# Patient Record
Sex: Male | Born: 1945 | Race: White | Hispanic: No | Marital: Married
Health system: Midwestern US, Academic
[De-identification: ages and names within clinical notes are randomized; demographics above are authoritative.]

---

## 2017-06-09 ENCOUNTER — Ambulatory Visit: Admit: 2017-06-09 | Discharge: 2017-06-10 | Payer: MEDICARE

## 2017-06-09 ENCOUNTER — Encounter: Admit: 2017-06-09 | Discharge: 2017-06-09 | Payer: MEDICARE

## 2017-06-09 DIAGNOSIS — R600 Localized edema: ICD-10-CM

## 2017-06-09 DIAGNOSIS — I10 Essential (primary) hypertension: Principal | ICD-10-CM

## 2017-06-09 DIAGNOSIS — M549 Dorsalgia, unspecified: ICD-10-CM

## 2017-06-09 DIAGNOSIS — Z8546 Personal history of malignant neoplasm of prostate: ICD-10-CM

## 2017-06-09 DIAGNOSIS — R609 Edema, unspecified: Principal | ICD-10-CM

## 2017-06-09 MED ORDER — METOPROLOL SUCCINATE 50 MG PO TB24
50 mg | ORAL_TABLET | Freq: Every day | ORAL | 3 refills | 90.00000 days | Status: AC
Start: 2017-06-09 — End: ?

## 2017-06-09 MED ORDER — LOSARTAN-HYDROCHLOROTHIAZIDE 100-25 MG PO TAB
1 | ORAL_TABLET | Freq: Every day | ORAL | 3 refills | 28.00000 days | Status: AC
Start: 2017-06-09 — End: 2019-07-19

## 2018-01-07 ENCOUNTER — Encounter: Admit: 2018-01-07 | Discharge: 2018-01-07 | Payer: MEDICARE

## 2018-01-07 DIAGNOSIS — I10 Essential (primary) hypertension: Principal | ICD-10-CM

## 2018-01-07 DIAGNOSIS — Z8546 Personal history of malignant neoplasm of prostate: ICD-10-CM

## 2018-01-07 DIAGNOSIS — M549 Dorsalgia, unspecified: ICD-10-CM

## 2018-05-27 LAB — LIPID PROFILE
Lab: 148 — ABNORMAL LOW (ref 150–200)
Lab: 239 — ABNORMAL HIGH (ref 30–200)
Lab: 31 — ABNORMAL LOW (ref 35–60)
Lab: 48 — ABNORMAL HIGH (ref 5–40)
Lab: 5

## 2018-06-04 LAB — COMPREHENSIVE METABOLIC PANEL
Lab: 0.5
Lab: 1.1
Lab: 104 — ABNORMAL LOW (ref 42.0–52.0)
Lab: 142 — ABNORMAL LOW (ref 4.70–6.10)
Lab: 23 — ABNORMAL HIGH (ref 27.0–31.0)
Lab: 28
Lab: 3.6 — ABNORMAL LOW (ref 14.0–18.0)
Lab: 4.1
Lab: 9.4
Lab: 91
Lab: 95

## 2018-06-04 LAB — CBC: Lab: 8.4

## 2018-07-06 ENCOUNTER — Ambulatory Visit: Admit: 2018-07-06 | Discharge: 2018-07-07 | Payer: MEDICARE

## 2018-07-06 ENCOUNTER — Encounter: Admit: 2018-07-06 | Discharge: 2018-07-06 | Payer: MEDICARE

## 2018-07-06 DIAGNOSIS — I10 Essential (primary) hypertension: Principal | ICD-10-CM

## 2018-07-06 DIAGNOSIS — M549 Dorsalgia, unspecified: ICD-10-CM

## 2018-07-06 DIAGNOSIS — Z8546 Personal history of malignant neoplasm of prostate: ICD-10-CM

## 2018-07-06 DIAGNOSIS — E7800 Pure hypercholesterolemia, unspecified: ICD-10-CM

## 2018-11-15 ENCOUNTER — Encounter: Admit: 2018-11-15 | Discharge: 2018-11-16 | Payer: MEDICARE

## 2018-11-18 ENCOUNTER — Encounter: Admit: 2018-11-18 | Discharge: 2018-11-19 | Payer: MEDICARE

## 2018-12-03 ENCOUNTER — Encounter: Admit: 2018-12-03 | Discharge: 2018-12-03 | Payer: MEDICARE

## 2018-12-23 ENCOUNTER — Encounter: Admit: 2018-12-23 | Discharge: 2018-12-23 | Payer: MEDICARE

## 2018-12-30 ENCOUNTER — Encounter: Admit: 2018-12-30 | Discharge: 2018-12-30 | Payer: MEDICARE

## 2018-12-31 ENCOUNTER — Ambulatory Visit: Admit: 2018-12-31 | Discharge: 2019-01-01 | Payer: MEDICARE

## 2018-12-31 ENCOUNTER — Encounter: Admit: 2018-12-31 | Discharge: 2018-12-31 | Payer: MEDICARE

## 2018-12-31 DIAGNOSIS — R600 Localized edema: Principal | ICD-10-CM

## 2018-12-31 MED ORDER — DOXAZOSIN 8 MG PO TR24
8 mg | ORAL_TABLET | Freq: Every day | ORAL | 3 refills | 30.00000 days | Status: AC
Start: 2018-12-31 — End: 2019-01-04

## 2019-01-03 ENCOUNTER — Encounter: Admit: 2019-01-03 | Discharge: 2019-01-03 | Payer: MEDICARE

## 2019-01-04 ENCOUNTER — Encounter: Admit: 2019-01-04 | Discharge: 2019-01-04 | Payer: MEDICARE

## 2019-01-04 MED ORDER — DOXAZOSIN 8 MG PO TAB
8 mg | Freq: Every day | ORAL | 0 refills | 30.00000 days | Status: AC
Start: 2019-01-04 — End: 2019-07-19

## 2019-01-10 ENCOUNTER — Encounter: Admit: 2019-01-10 | Discharge: 2019-01-10 | Payer: MEDICARE

## 2019-07-19 ENCOUNTER — Encounter: Admit: 2019-07-19 | Discharge: 2019-07-19 | Payer: MEDICARE

## 2019-07-19 DIAGNOSIS — I10 Essential (primary) hypertension: Secondary | ICD-10-CM

## 2019-07-19 DIAGNOSIS — M549 Dorsalgia, unspecified: Secondary | ICD-10-CM

## 2019-07-19 DIAGNOSIS — Z8546 Personal history of malignant neoplasm of prostate: Secondary | ICD-10-CM

## 2019-07-19 MED ORDER — DOXAZOSIN 8 MG PO TAB
8 mg | ORAL_TABLET | Freq: Every day | ORAL | 3 refills | 30.00000 days | Status: DC
Start: 2019-07-19 — End: 2019-10-17

## 2019-07-19 MED ORDER — DOXAZOSIN 2 MG PO TAB
2 mg | ORAL_TABLET | Freq: Every day | ORAL | 3 refills | 30.00000 days | Status: DC
Start: 2019-07-19 — End: 2019-08-16

## 2019-08-02 ENCOUNTER — Encounter: Admit: 2019-08-02 | Discharge: 2019-08-02 | Payer: MEDICARE

## 2019-08-02 NOTE — Progress Notes
Patient presented to clinic for a nursing visit for a blood pressure check after recent medication changes.  Blood pressure still remains elevated.  Today bp is 142/84 p 65.  Pt denies complaints.  Patient reports he is checking his blood pressure before his morning medication then about an hour or so after his medications.  I asked pt to check his blood pressure about an hour after his morning medications and sit and rest 10-15 prior to checking bp.  Per TLR note, increase doxazosin to 12 mg daily. Pt verbalizes understanding. He will continue to monitor bp at home and bring a log when he returns in 2 weeks for a bp check.      Home bp readings:    08/02/19 161/100  08/01/19 147/93  10/25  145/91  10/24  138/92  10/23  143/99 154/81  10/22  146/96 133/88  10/21  164/91 148/82  10/20  154/99  10/19  150/98  10/18  138/97  10/16  140/98  10/15  146/93  10/14  152/87

## 2019-08-16 ENCOUNTER — Encounter: Admit: 2019-08-16 | Discharge: 2019-08-16 | Payer: MEDICARE

## 2019-08-16 MED ORDER — DOXAZOSIN 2 MG PO TAB
4 mg | ORAL_TABLET | Freq: Every day | ORAL | 3 refills | 30.00000 days | Status: DC
Start: 2019-08-16 — End: 2019-09-08

## 2019-09-08 ENCOUNTER — Encounter: Admit: 2019-09-08 | Discharge: 2019-09-08 | Payer: MEDICARE

## 2019-09-08 MED ORDER — DOXAZOSIN 2 MG PO TAB
4 mg | ORAL_TABLET | Freq: Every day | ORAL | 3 refills | 30.00000 days | Status: AC
Start: 2019-09-08 — End: ?

## 2019-10-16 ENCOUNTER — Encounter: Admit: 2019-10-16 | Discharge: 2019-10-16 | Payer: MEDICARE

## 2019-10-17 MED ORDER — DOXAZOSIN 8 MG PO TAB
ORAL_TABLET | Freq: Every day | ORAL | 3 refills | 30.00000 days | Status: AC
Start: 2019-10-17 — End: ?

## 2020-01-06 ENCOUNTER — Encounter: Admit: 2020-01-06 | Discharge: 2020-01-06 | Payer: MEDICARE

## 2020-01-06 DIAGNOSIS — Z8546 Personal history of malignant neoplasm of prostate: Secondary | ICD-10-CM

## 2020-01-06 DIAGNOSIS — M549 Dorsalgia, unspecified: Secondary | ICD-10-CM

## 2020-01-06 DIAGNOSIS — M545 Low back pain: Secondary | ICD-10-CM

## 2020-01-06 DIAGNOSIS — I10 Essential (primary) hypertension: Secondary | ICD-10-CM

## 2020-01-12 ENCOUNTER — Ambulatory Visit: Admit: 2020-01-12 | Discharge: 2020-01-12 | Payer: MEDICARE

## 2020-01-12 ENCOUNTER — Encounter: Admit: 2020-01-12 | Discharge: 2020-01-12 | Payer: MEDICARE

## 2020-01-12 NOTE — Progress Notes
[facility] HISTORY AND PHYSICAL    No chief complaint on file.      Subjective      Dictation on: 01/12/2020  2:04 PM by: Sue Lush [RSTUCKER]              Medical History:   Diagnosis Date   ? Chronic back pain 04/02/2009   ? History of prostate cancer 04/02/2009   ? HTN (hypertension) 04/02/2009       Surgical History:   Procedure Laterality Date   ? CHOLANGIOPANCREATOGRAPHY ENDOSCOPY RETROGRADE N/A 08/26/2015    Performed by Vertell Novak, MD at [facility] ENDO   ? CHOLECYSTECTOMY     ? KNEE REPLACEMENT Left    ? KNEE REPLACEMENT Right    ? LAMINECTOMY     ? TONSILLECTOMY         family history includes Cancer in his sister; Coronary Artery Disease in his father; Diabetes in his brother, sister, and sister; Glaucoma in his mother.    Social History     Socioeconomic History   ? Marital status: Married     Spouse name: Not on file   ? Number of children: Not on file   ? Years of education: Not on file   ? Highest education level: Not on file   Occupational History   ? Not on file   Tobacco Use   ? Smoking status: Former Smoker   ? Smokeless tobacco: Never Used   Substance and Sexual Activity   ? Alcohol use: No   ? Drug use: No   ? Sexual activity: Not on file   Other Topics Concern   ? Not on file   Social History Narrative   ? Not on file       Allergies   Allergen Reactions   ? Asacol [Mesalamine] UNKNOWN   ? Bextra [Valdecoxib] UNKNOWN   ? Codeine UNKNOWN   ? Pcn [Penicillins] UNKNOWN   ? Ultram [Tramadol] UNKNOWN   ? Vioxx [Rofecoxib] UNKNOWN         Current Outpatient Medications:   ?  doxazosin (CARDURA) 2 mg tablet, Take two tablets by mouth daily., Disp: 180 tablet, Rfl: 3  ?  doxazosin (CARDURA) 8 mg tablet, Take 1 tablet by mouth once daily, Disp: 90 tablet, Rfl: 3  ?  loratadine (CLARITIN) 10 mg tablet, Take 10 mg by mouth every morning., Disp: , Rfl:   ?  losartan (COZAAR) 100 mg tablet, Take 100 mg by mouth daily., Disp: , Rfl:   ?  meloxicam (MOBIC) 7.5 mg tablet, Take 7.5 mg by mouth daily., Disp: , Rfl:   ?  metoprolol XL (TOPROL XL) 50 mg extended release tablet, Take one tablet by mouth daily., Disp: 90 tablet, Rfl: 3  ?  omeprazole DR(+) (PRILOSEC) 40 mg capsule, Take 1 capsule by mouth daily., Disp: , Rfl:     Vitals:    01/12/20 1323   Weight: 111.6 kg (246 lb)   Height: 182 cm (71.65)   PainSc: Two            Pain Score: Two    Body mass index is 33.69 kg/m?Marland Kitchen    Review of Systems

## 2020-01-13 ENCOUNTER — Encounter: Admit: 2020-01-13 | Discharge: 2020-01-13 | Payer: MEDICARE

## 2020-01-13 DIAGNOSIS — I10 Essential (primary) hypertension: Secondary | ICD-10-CM

## 2020-01-13 DIAGNOSIS — M549 Dorsalgia, unspecified: Secondary | ICD-10-CM

## 2020-01-13 DIAGNOSIS — Z8546 Personal history of malignant neoplasm of prostate: Secondary | ICD-10-CM

## 2020-01-14 ENCOUNTER — Encounter: Admit: 2020-01-14 | Discharge: 2020-01-14 | Payer: MEDICARE

## 2020-10-09 ENCOUNTER — Encounter: Admit: 2020-10-09 | Discharge: 2020-10-09 | Payer: MEDICARE

## 2020-10-09 MED ORDER — DOXAZOSIN 2 MG PO TAB
ORAL_TABLET | Freq: Every day | ORAL | 0 refills | 30.00000 days | Status: AC
Start: 2020-10-09 — End: ?

## 2020-10-18 ENCOUNTER — Encounter: Admit: 2020-10-18 | Discharge: 2020-10-18 | Payer: MEDICARE

## 2020-10-18 MED ORDER — DOXAZOSIN 8 MG PO TAB
ORAL_TABLET | Freq: Every day | 0 refills
Start: 2020-10-18 — End: ?

## 2021-04-07 ENCOUNTER — Encounter: Admit: 2021-04-07 | Discharge: 2021-04-07 | Payer: MEDICARE

## 2021-04-07 MED ORDER — DOXAZOSIN 2 MG PO TAB
ORAL_TABLET | Freq: Every day | 0 refills
Start: 2021-04-07 — End: ?

## 2021-04-11 ENCOUNTER — Encounter: Admit: 2021-04-11 | Discharge: 2021-04-11 | Payer: MEDICARE

## 2021-04-11 MED ORDER — DOXAZOSIN 2 MG PO TAB
ORAL_TABLET | Freq: Every day | 0 refills
Start: 2021-04-11 — End: ?

## 2023-03-26 IMAGING — MR SPCERVWO
6 of 9 series · 27 of 48 positions shown · non-contrast
Comparison: none

[Series 5: T2 · sagittal · 3.0mm · 0.69mm/px · 3 of 17 slices shown (1 of 2)]
[im 1/17]
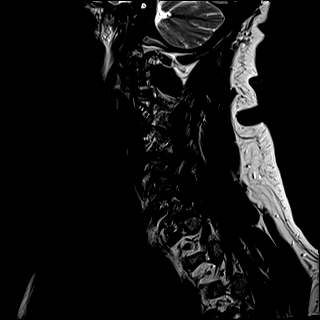
[im 9/17]
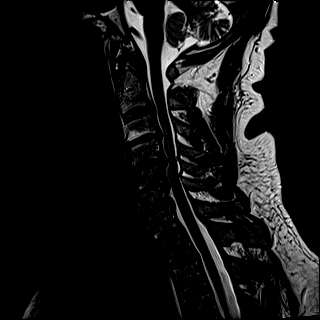
[im 17/17]
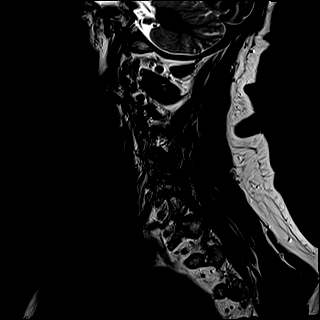

[Series 6: T1 · sagittal · 3.0mm · 0.43mm/px · 3 of 17 slices shown (1 of 2)]
[im 1/17]
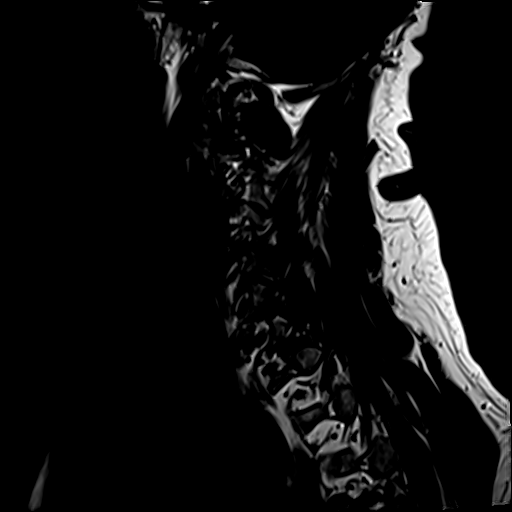
[im 9/17]
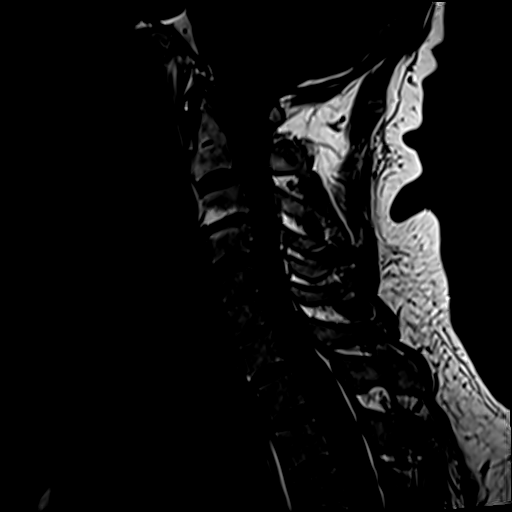
[im 17/17]
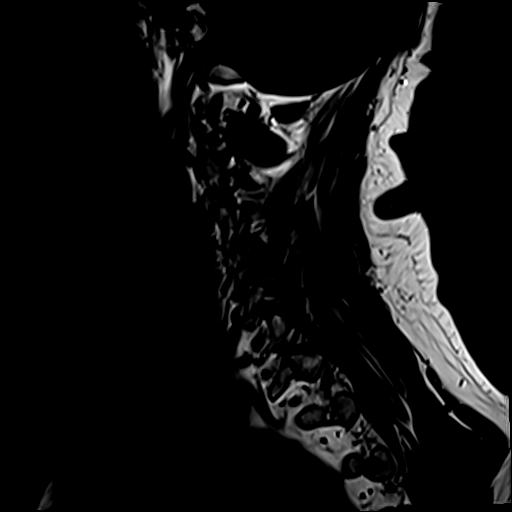

[Series 7: STIR · sagittal · 3.0mm · 0.86mm/px · 3 of 17 slices shown]
[im 1/17]
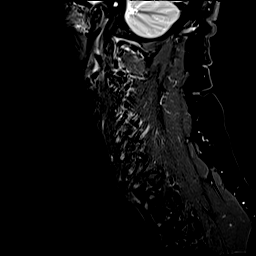
[im 9/17]
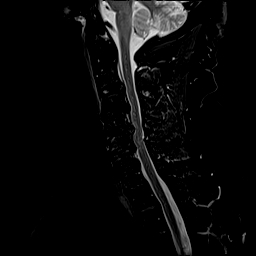
[im 17/17]
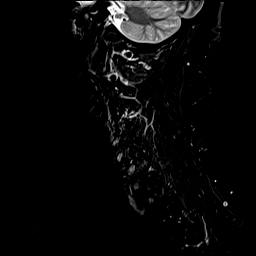

[Series 8: T2 · axial · 3.0mm · 0.70mm/px · z∈[-131,-21]mm · 6 of 35 slices shown (2 of 2)]
[im 1/35]
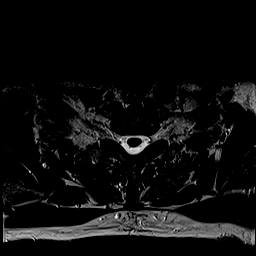
[im 7/35]
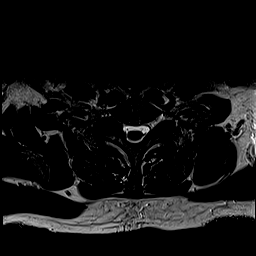
[im 14/35]
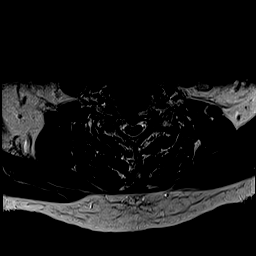
[im 21/35]
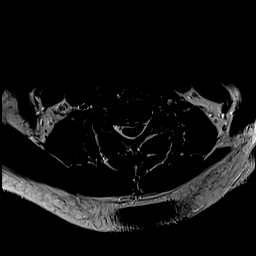
[im 28/35]
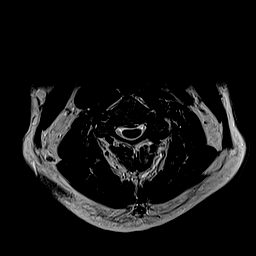
[im 35/35]
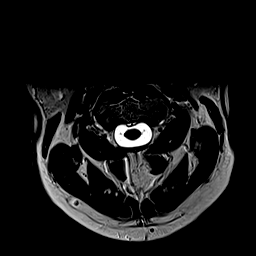

[Series 9: GRE · axial · 3.0mm · 0.47mm/px · z∈[-131,-21]mm · 6 of 35 slices shown]
[im 1/35]
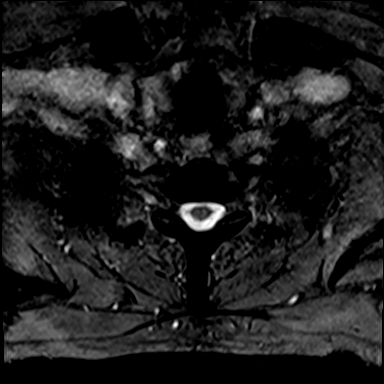
[im 7/35]
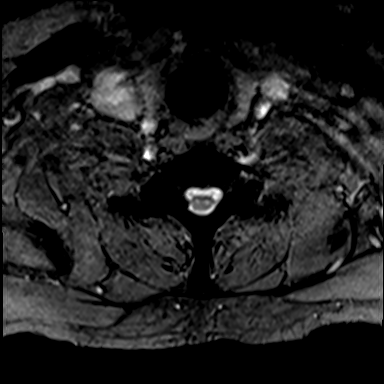
[im 14/35]
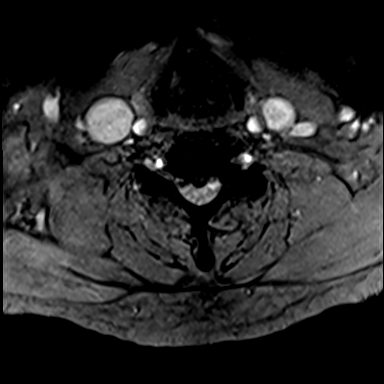
[im 21/35]
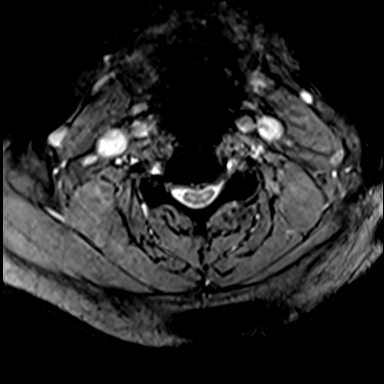
[im 28/35]
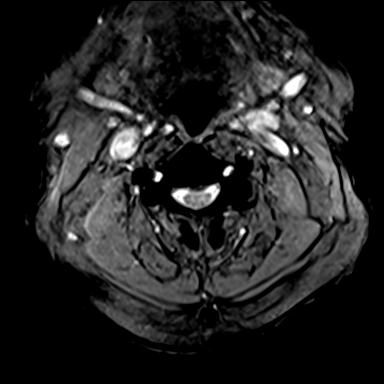
[im 35/35]
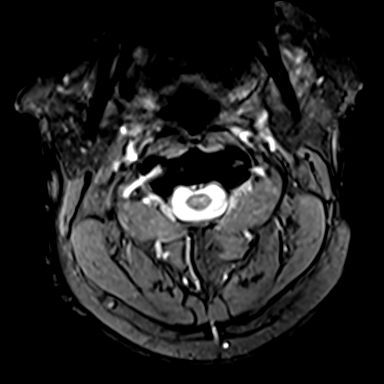

[Series 10: T1 · axial · 3.0mm · 0.35mm/px · z∈[-131,-21]mm · 6 of 35 slices shown (2 of 2)]
[im 1/35]
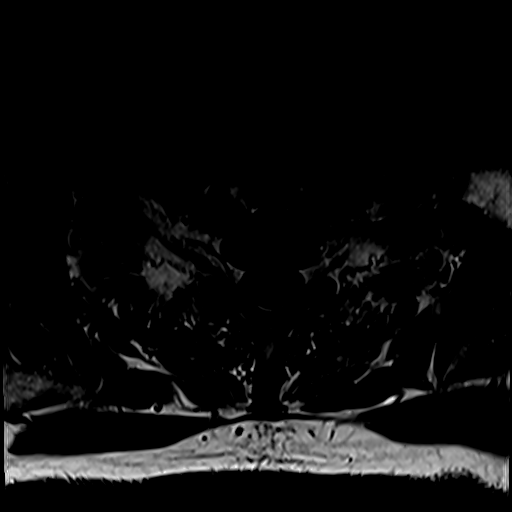
[im 7/35]
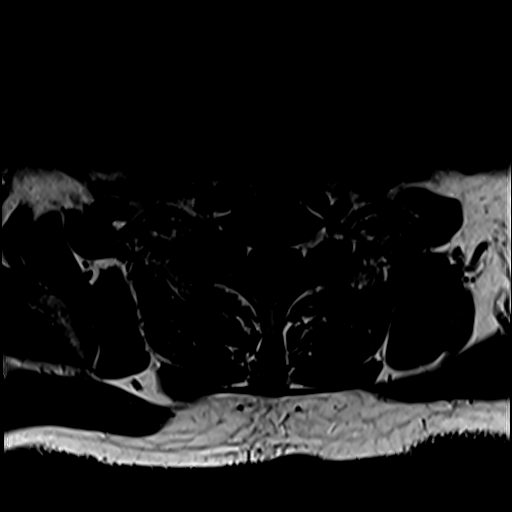
[im 14/35]
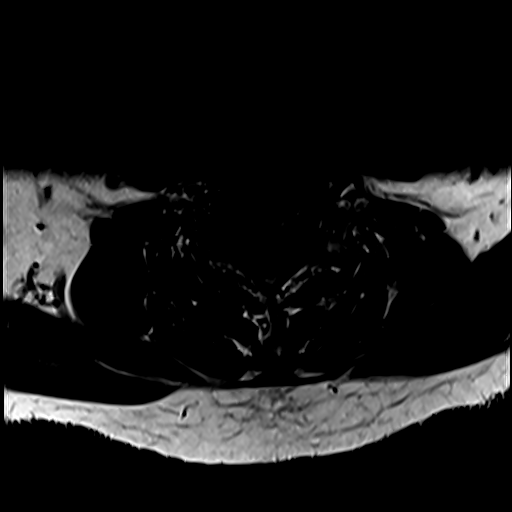
[im 21/35]
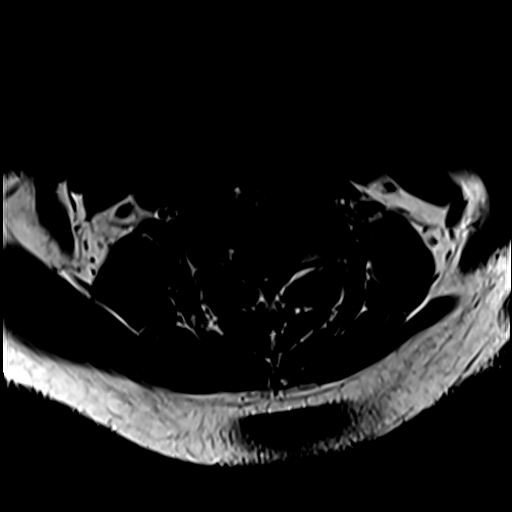
[im 28/35]
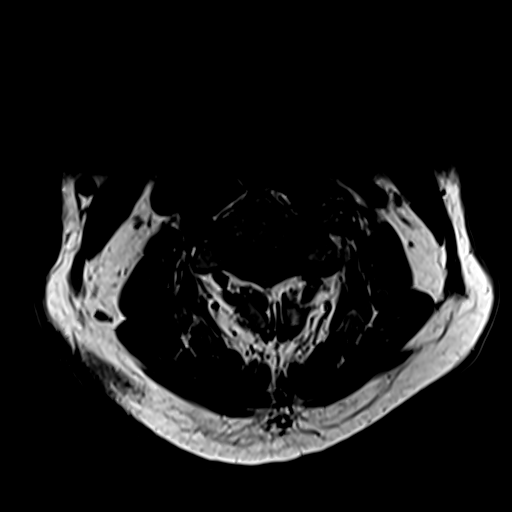
[im 35/35]
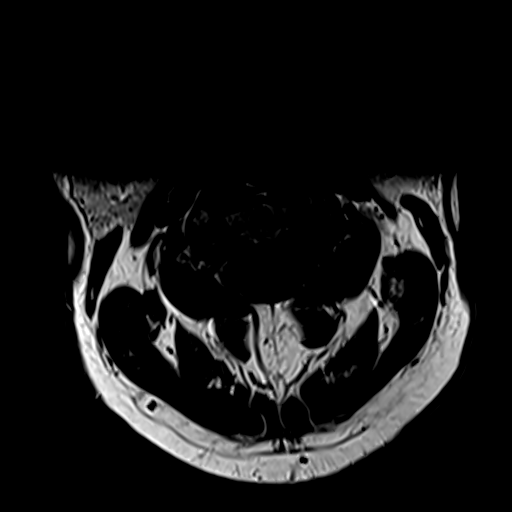

[27 of 48 positions shown; findings below may reference images not displayed]

Ordering:    TIGER, CALLUM

DIAGNOSTIC STUDIES

EXAM

MRI of the cervical spine without contrast.

INDICATION

Cervical radiculopathy
NECK PAIN, PAIN DOWN LEFT ARM AFTER BEING KICKED BY A HORSE 2-3 MONTHS AGO TO LEFT ARM.  RG

TECHNIQUE

Sagittal axial images were obtained with variable T1 and T2 weighting.

COMPARISONS

November 18, 2018

FINDINGS

There is straightening of the cervical lordosis probably due to positioning or spasm. No abnormal
signal properties are seen throughout the visualized brainstem, cervical cord, or visualized
thoracic spinal cord.

Prominent degenerative changes are noted at C1-2.

C2-3: There is disc osteophyte complex at this level without significant central canal or neural
foraminal stenosis.

C3-4: Loss of height and prominent disc osteophyte complex results in mild central canal narrowing.
There is moderate to severe right bony neural foraminal stenosis and moderate left bony neural
foraminal stenosis.

C4-5: Marked disc osteophyte complex is seen resulting in mild-to-moderate central canal narrowing.
There is moderate left bony neural foraminal stenosis and severe right bony neural foraminal
stenosis.

C5-6: Loss of height and disc osteophyte complex is seen with facet and uncovertebral hypertrophy.
Moderate to severe bilateral bony neural foraminal stenosis seen.

C6-7: Disc osteophyte complex is noted with prominent right-sided uncovertebral and facet
hypertrophy. There is severe right bony neural foraminal stenosis and mild left bony neural foramin
al stenosis.

C7-T1: Disc osteophyte complex is seen without significant central canal stenosis. There is moderate
right bony neural foraminal stenosis.

IMPRESSION

Advanced spondylitic changes at all cervical levels resulting in varying degrees of central canal
and neural foraminal stenosis. Please see above discussion for individual levels.

Tech Notes:

NECK PAIN, PAIN DOWN LEFT ARM AFTER BEING KICKED BY A HORSE 2-3 MONTHS AGO TO LEFT ARM.  RG

## 2023-06-08 ENCOUNTER — Encounter: Admit: 2023-06-08 | Discharge: 2023-06-08 | Payer: MEDICARE

## 2023-06-08 ENCOUNTER — Inpatient Hospital Stay: Admit: 2023-06-08 | Discharge: 2023-06-08 | Payer: MEDICARE

## 2023-06-08 ENCOUNTER — Ambulatory Visit: Admit: 2023-06-08 | Discharge: 2023-06-08 | Payer: MEDICARE

## 2023-06-08 ENCOUNTER — Inpatient Hospital Stay: Admit: 2023-06-08 | Payer: MEDICARE

## 2023-06-08 DIAGNOSIS — S06360A Traumatic hemorrhage of cerebrum, unspecified, without loss of consciousness, initial encounter: Secondary | ICD-10-CM

## 2023-06-08 LAB — COMPREHENSIVE METABOLIC PANEL
BLD UREA NITROGEN: 20 mg/dL (ref 7–25)
CALCIUM: 9 mg/dL (ref 8.5–10.6)
CREATININE: 0.9 mg/dL (ref 0.4–1.24)
EGFR: >60 mL/min (ref >60)
GLUCOSE,PANEL: 111 mg/dL — ABNORMAL HIGH (ref 70–100)
SODIUM: 139 MMOL/L (ref 137–147)
TOTAL PROTEIN: 7.3 g/dL (ref 6.0–8.0)

## 2023-06-08 LAB — LIPID PROFILE
CHOLESTEROL: 160 mg/dL (ref <200)
HDL: 40 mg/dL — ABNORMAL LOW (ref >40)
LDL: 119 mg/dL — ABNORMAL HIGH (ref <100)
NON HDL CHOLESTEROL: 120 mg/dL — ABNORMAL HIGH (ref 3–12)
TRIGLYCERIDES: 113 mg/dL (ref <150)
VLDL: 23 mg/dL (ref 7–56)

## 2023-06-08 LAB — MAGNESIUM: MAGNESIUM: 1.8 mg/dL — ABNORMAL HIGH (ref 1.6–2.6)

## 2023-06-08 LAB — PROTIME INR (PT): PROTIME: 14 s — ABNORMAL HIGH (ref 10.2–12.9)

## 2023-06-08 LAB — IONIZED CALCIUM: IONIZED CALCIUM: 1.1 MMOL/L (ref 1.0–1.3)

## 2023-06-08 LAB — PTT (APTT): PTT: 38 s — ABNORMAL HIGH (ref 24.0–36.5)

## 2023-06-08 LAB — CBC AND DIFF
ABSOLUTE BASO COUNT: 0 10*3/uL (ref 0–0.20)
WBC COUNT: 10 10*3/uL (ref 4.5–11.0)

## 2023-06-08 LAB — PHOSPHORUS: PHOSPHORUS: 2.2 mg/dL — ABNORMAL HIGH (ref 2.0–4.5)

## 2023-06-08 MED ORDER — MAGNESIUM HYDROXIDE 400 MG/5 ML PO SUSP
30 mL | Freq: Every day | ORAL | 0 refills | Status: AC
Start: 2023-06-08 — End: ?

## 2023-06-08 MED ORDER — ALPRAZOLAM 0.25 MG PO TAB
.25 mg | Freq: Three times a day (TID) | ORAL | 0 refills | Status: DC | PRN
Start: 2023-06-08 — End: 2023-06-08

## 2023-06-08 MED ORDER — ALPRAZOLAM 0.25 MG PO TAB
.25 mg | Freq: Three times a day (TID) | ORAL | 0 refills | Status: AC | PRN
Start: 2023-06-08 — End: ?
  Administered 2023-06-09: 03:00:00 0.25 mg via ORAL

## 2023-06-08 MED ORDER — ACETAMINOPHEN 325 MG PO TAB
650 mg | ORAL | 0 refills | Status: AC | PRN
Start: 2023-06-08 — End: ?
  Administered 2023-06-09 – 2023-06-10 (×3): 650 mg via ORAL

## 2023-06-08 MED ORDER — HUM PROTHROMBIN CPLX(PCC)4FACT 500 UNIT (400-620 UNIT) IV SOLR
2858 [IU] | Freq: Once | INTRAVENOUS | 0 refills | Status: CP
Start: 2023-06-08 — End: ?
  Administered 2023-06-08: 20:00:00 2858 [IU] via INTRAVENOUS

## 2023-06-08 MED ORDER — HYDRALAZINE 20 MG/ML IJ SOLN
10 mg | INTRAVENOUS | 0 refills | Status: DC | PRN
Start: 2023-06-08 — End: 2023-06-08

## 2023-06-08 MED ORDER — LOSARTAN 50 MG PO TAB
100 mg | Freq: Every day | ORAL | 0 refills | Status: AC
Start: 2023-06-08 — End: ?
  Administered 2023-06-09 – 2023-06-12 (×5): 100 mg via ORAL

## 2023-06-08 MED ORDER — IOHEXOL 350 MG IODINE/ML IV SOLN
60 mL | Freq: Once | INTRAVENOUS | 0 refills | Status: CP
Start: 2023-06-08 — End: ?
  Administered 2023-06-08: 60 mL via INTRAVENOUS

## 2023-06-08 MED ORDER — DOCUSATE SODIUM 100 MG PO CAP
100 mg | Freq: Two times a day (BID) | ORAL | 0 refills | Status: AC
Start: 2023-06-08 — End: ?
  Administered 2023-06-09 – 2023-06-12 (×4): 100 mg via ORAL

## 2023-06-08 MED ORDER — HYDRALAZINE 20 MG/ML IJ SOLN
10 mg | INTRAVENOUS | 0 refills | Status: AC | PRN
Start: 2023-06-08 — End: ?

## 2023-06-08 MED ORDER — SODIUM CHLORIDE 0.9 % IJ SOLN
50 mL | Freq: Once | INTRAVENOUS | 0 refills | Status: CP
Start: 2023-06-08 — End: ?
  Administered 2023-06-08: 50 mL via INTRAVENOUS

## 2023-06-08 MED ORDER — SENNOSIDES-DOCUSATE SODIUM 8.6-50 MG PO TAB
1 | Freq: Two times a day (BID) | ORAL | 0 refills | Status: AC
Start: 2023-06-08 — End: ?
  Administered 2023-06-09 – 2023-06-12 (×4): 1 via ORAL

## 2023-06-08 MED ORDER — LABETALOL 5 MG/ML IV SOLN
10 mg | INTRAVENOUS | 0 refills | Status: AC | PRN
Start: 2023-06-08 — End: ?
  Administered 2023-06-08 – 2023-06-09 (×3): 10 mg via INTRAVENOUS

## 2023-06-08 MED ORDER — GADOBENATE DIMEGLUMINE 529 MG/ML (0.1MMOL/0.2ML) IV SOLN
20 mL | Freq: Once | INTRAVENOUS | 0 refills | Status: CP
Start: 2023-06-08 — End: ?
  Administered 2023-06-09: 04:00:00 20 mL via INTRAVENOUS

## 2023-06-08 MED ORDER — NICARDIPINE IN NACL (ISO-OS) 20 MG/200 ML (0.1 MG/ML) IV PGBK
5-15 mg/h | INTRAVENOUS | 0 refills | Status: AC
Start: 2023-06-08 — End: ?
  Administered 2023-06-08 (×2): 15 mg/h via INTRAVENOUS
  Administered 2023-06-09: 04:00:00 10 mg/h via INTRAVENOUS
  Administered 2023-06-09: 06:00:00 7.5 mg/h via INTRAVENOUS
  Administered 2023-06-09: 02:00:00 12.5 mg/h via INTRAVENOUS
  Administered 2023-06-09: 15 mg/h via INTRAVENOUS

## 2023-06-08 MED ORDER — LABETALOL 5 MG/ML IV SOLN
10 mg | INTRAVENOUS | 0 refills | Status: DC | PRN
Start: 2023-06-08 — End: 2023-06-08

## 2023-06-08 MED ADMIN — NICARDIPINE IN NACL (ISO-OS) 20 MG/200 ML (0.1 MG/ML) IV PGBK [169819]: 5 mg/h | INTRAVENOUS | @ 20:00:00 | Stop: 2023-06-08 | NDC 43066000910

## 2023-06-08 NOTE — Progress Notes
All belongings gathered and placed in belonging bag with patient labels at bedside.  The bag(s) contain(s) the following:    Clothing: jeans; gray underwear; gray socks  Shoes: brown slippers  Jewelry: watch  Identification/Driver's License: Yes  Dentures/Glasses/Hearing aids: glasses  Other: brown wallet    All belongings placed in 1 bag(s).    Belongings disposition: with patient at bedside in [id number].

## 2023-06-08 NOTE — Progress Notes
HPI: 77 yo male pt c/o head ache and balance issues.  States that this caused him to fall at home.  Pt arrived to ED alert and oriented x 3.  NIH 0.  Hypertension noted    PMH: on xarelto    Vital Signs: 194/94, HR 80, rr 18, 96% ra    Neuro exam: alert and oriented x 3.  No focal deficits    Imaging / Procedures: CT head - rt intraparenchymal hemorrhage measuring 28mm x 15mm with 1 mm midline shift    Labs:    Meds / Drips: starting nicardipine for BP control.    Reason for Transfer: HLOC - states sending facility does not have meds available to reverse xarelto.

## 2023-06-08 NOTE — H&P (View-Only)
Neuro Critical Care History and Physical    Bryn Gulling  Admission Date: (Not on file)  LOS: 0 days  Prior                      ASSESSMENT/PLAN     Patient Active Problem List    Diagnosis Date Noted    Pure hypercholesterolemia 07/06/2018    Edema 06/09/2017    Preop cardiovascular exam 05/22/2016    Bilateral leg edema 05/22/2016    Dry cough 09/04/2015    History of cholecystectomy 08/27/2015    Essential hypertension 04/02/2009    Chronic back pain 04/02/2009    History of prostate cancer 04/02/2009       NAHJEE KARSTETTER is a 77 y.o. 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 On presentation he had a blood pressure of 194/94, head CT revealed 3 cm right sided basal ganglia hemorrhage. OSH did not have proper reversal of xarelto, transferred to [facility].     Hospital and ICU course:   9/2: NEICU admission    Neuro:   Basal ganglia hemorrhage (ICH score 0)   Chronic microvascular disease  Distant history laminectomy (no fusion)  Neuro Critical Care ICH Score    GCS (3-4, or 5-12, or 13-15): 0  Age ? 80: 0  ICH volume ? 30 ml: 0  IVH present: 0  Infratentorial origin of hemorrhage: 0   Total score on admission: 0     Pt on anticoagulation currently? Yes    - if yes, what agent? Xarelto      - If yes, reversal agent used and when: Kecentra once admitted at 1511    INR on admission? 1.4    Cognitive impairment suspected? No    -Stability CTA head and neck 6 hours post first scan   -CTA vs. CT head due to loose history of potential TIAs  -MRI head with and without overnight  -Systolic less than 140, MAP less than 110.    - Neuro-ICU monitoring, neurochecks q 1 hrs, parameters for prevention of secondary brain injury (avoid hypotension, hypoxia, fever, hyperglycemia,significant anemia, diagnose and treatment of seizures, electrolyte abnormalities)     Sedation/Pain Management:   - agents prn Acetaminophen    - PTA Xanax  - Assess for delirium daily    Cardiac:   Afib on Xarelto   --> reversed with KCentra   AAA  Hypertension    - metoprolol tartrate 25 mg BID PTA med   - losartan 100 mg daily PTA med  Plan:   - SBP goal: < 140  - MAP goal < 110  - Prn cardene titration, labetelol, and hydralazine for SBP goals.   - Tight right calf   - doppler venous ultrasound     Respiratory:    - PaO2 goal >100, Spo2 goal >95%,    GI:  - Feeding: Regular diet  - neuro bowel regimen, ensure daily BM    Heme:   - VTE prophylaxis: Mechanical prophylaxis; Sequential compression device  - assess for coagulopathy, maintain platelets above 100k, INR <1.5  - Tight right calf   - doppler venous ultrasound     ID:   - aim for normothermia, Temp <38.3 celsius, normothermia protocol if febrile    Renal:   - Monitor hourly I/O balance   - Aim  for normovolemia    Endocrine:    - Blood glucose goal 100-180mg /dl    FEN:   - IVF: none   - Critical care electrolyte replacement protocol monitor electrolytes, engage in protocol if derangement.  - Magnesium goal >2.0, i-Cal goal > 1.0, Potassium goal >4.0 mEq/L    Prophylaxis Review:   A) GI: none  B) Lines:  No  C) Urinary Catheter:  No  D) Antibiotic Usage:  No  E) VTE:  Mechanical prophylaxis; Sequential compression device  F) Isolation: No   G)Seizures: No  H) Restraints: Patient assessed for need for restraints.   I) Disposition/Family:  ICU     Primary service: NEICU    Consults:  None      ___________________________________________________________________  SUBJECTIVE   Chief Complaint:  Basal ganglia bleed in the setting of hypertensive crisis.     History of Present Illness: RAKESH SHUFORD is a 77 y.o. 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 On presentation he had a blood pressure of 194/94, head CT revealed 3 cm right sided basal ganglia hemorrhage. OSH did not have proper reversal of xarelto, transferred to [place].     Past Medical History:   Diagnosis Date    Chronic back pain 04/02/2009    History of prostate cancer 04/02/2009    HTN (hypertension) 04/02/2009       Surgical History:   Procedure Laterality Date    CHOLANGIOPANCREATOGRAPHY ENDOSCOPY RETROGRADE N/A 08/26/2015    Performed by Vertell Novak, MD at [facility] ENDO    CHOLECYSTECTOMY      KNEE REPLACEMENT Left     KNEE REPLACEMENT Right     LAMINECTOMY      TONSILLECTOMY         Family History   Problem Relation Name Age of Onset    Glaucoma Mother      Coronary Artery Disease Father      Diabetes Sister      Cancer Sister      Diabetes Brother      Diabetes Sister         Social History     Social History Narrative    Not on file           Code Status: Full Code    Decision Maker: Self     Immunizations (includes history and patient reported):   There is no immunization history on file for this patient.        Allergies:  Asacol [mesalamine], Bextra [valdecoxib], Codeine, Pcn [penicillins], Ultram [tramadol], and Vioxx [rofecoxib]    Medications Prior to Admission   Medication Sig    ALPRAZolam (XANAX) 0.25 mg tablet Take one tablet by mouth three times daily as needed for Anxiety.    doxazosin (CARDURA) 2 mg tablet Take 2 tablets by mouth once daily    doxazosin (CARDURA) 8 mg tablet Take 1 tablet by mouth once daily    loratadine (CLARITIN) 10 mg tablet Take 10 mg by mouth every morning.    losartan (COZAAR) 100 mg tablet Take 100 mg by mouth daily.    meloxicam (MOBIC) 7.5 mg tablet Take 7.5 mg by mouth daily.    metoprolol XL (TOPROL XL) 50 mg extended release tablet Take one tablet by mouth daily.    omeprazole DR(+) (PRILOSEC) 40 mg capsule Take 1 capsule by mouth daily.  Review of Systems:  All other systems reviewed and are negative.      OBJECTIVE                     Vital Signs: Last Filed                  Vital Signs: 24 Hour Range Intensity Pain Scale (Self Report): (not recorded) There were no vitals filed for this visit.      Artificial airway:  None              Ventilator/ Respiratory Therapy:  No   Vent weaning trial:  Not applicable    Lines:  None  Drains: None    Critical Care Vitals:      ICP Monitoring:     Hemodynamics/Oxycalcs:       Intake/Output Summary:  (Last 24 hours)  No intake or output data in the 24 hours ending 06/08/23 1259         Physical Exam:    There were no vitals taken for this visit.    Glasgow coma score:              E: 4 - Opens eyes on own          M: 6 - Follows simple motor commands              V: 5 - Alert and oriented      Neuro:   Mental Status: Alert and oriented x4      - Pupil exam: Size:         3mm                  - EOM: Intact    - Grimace/facial movement: present         Motor:         RUE: Strength: 5/5; Follows                     RLE: Strength: 5/5; Follows            LUE: Strength: 5/5; Follows         LLE: Strength: 5/5; Follows   Sensory: normal   Coordination: Normal           Extremities: extremities normal, atraumatic, no cyanosis or edema  Skin: Skin color, texture, turgor normal. No rashes or lesions    Point of Care Testing:  (Last 24 hours):       Lab Review:  Pertinent labs reviewed    Radiology and Other Diagnostic Procedures Review:  Pertinent radiologic and diagnostic procedures reviewed.        Radene Ou, MD Date:  06/08/2023   [phone]

## 2023-06-09 ENCOUNTER — Ambulatory Visit: Admit: 2023-06-09 | Discharge: 2023-06-09 | Payer: MEDICARE

## 2023-06-09 ENCOUNTER — Encounter: Admit: 2023-06-09 | Discharge: 2023-06-09 | Payer: MEDICARE

## 2023-06-09 DIAGNOSIS — I10 Essential (primary) hypertension: Secondary | ICD-10-CM

## 2023-06-09 DIAGNOSIS — I480 Paroxysmal atrial fibrillation: Secondary | ICD-10-CM

## 2023-06-09 DIAGNOSIS — M549 Dorsalgia, unspecified: Secondary | ICD-10-CM

## 2023-06-09 DIAGNOSIS — H3562 Retinal hemorrhage, left eye: Secondary | ICD-10-CM

## 2023-06-09 DIAGNOSIS — I619 Nontraumatic intracerebral hemorrhage, unspecified: Secondary | ICD-10-CM

## 2023-06-09 DIAGNOSIS — Z8546 Personal history of malignant neoplasm of prostate: Secondary | ICD-10-CM

## 2023-06-09 MED ADMIN — CARVEDILOL 12.5 MG PO TAB [77424]: 12.5 mg | ORAL | @ 16:00:00 | Stop: 2023-06-14 | NDC 00904630261

## 2023-06-09 MED ADMIN — MAGNESIUM SULFATE IN D5W 1 GRAM/100 ML IV PGBK [166578]: 1 g | INTRAVENOUS | @ 09:00:00 | Stop: 2023-06-09 | NDC 00338170940

## 2023-06-09 MED ADMIN — HYDRALAZINE 20 MG/ML IJ SOLN [3697]: 10 mg | INTRAVENOUS | @ 17:00:00 | Stop: 2023-06-09 | NDC 63323061400

## 2023-06-09 MED ADMIN — FLU VACC TS2024-25(65YR UP)-PF 180 MCG/0.5 ML IM SYRG [467263]: 0.5 mL | INTRAMUSCULAR | @ 15:00:00 | Stop: 2023-06-09 | NDC 49281012488

## 2023-06-09 MED ADMIN — POTASSIUM CHLORIDE 20 MEQ PO TBTQ [35943]: 40 meq | ORAL | @ 11:00:00 | Stop: 2023-06-09 | NDC 00832532510

## 2023-06-09 NOTE — Progress Notes
Patient taken to CT scan via bed on monitor with Resource RN and tech.  Patient tolerated travel well.  Patient placed back on bedside monitor.

## 2023-06-10 MED ADMIN — HYDRALAZINE 20 MG/ML IJ SOLN [3697]: 10 mg | INTRAVENOUS | @ 21:00:00 | NDC 63323061400

## 2023-06-10 MED ADMIN — LABETALOL 5 MG/ML IV SOLN [10372]: 10 mg | INTRAVENOUS | @ 17:00:00 | NDC 36000032001

## 2023-06-10 MED ADMIN — CARVEDILOL 12.5 MG PO TAB [77424]: 12.5 mg | ORAL | @ 14:00:00 | Stop: 2023-06-10 | NDC 00904630261

## 2023-06-10 MED ADMIN — ALUM-MAG HYDROXIDE-SIMETH 200-200-20 MG/5 ML PO SUSP [38285]: 30 mL | ORAL | @ 12:00:00 | Stop: 2023-06-10 | NDC 00904732562

## 2023-06-10 MED ADMIN — HYDRALAZINE 20 MG/ML IJ SOLN [3697]: 10 mg | INTRAVENOUS | @ 10:00:00 | NDC 63323061400

## 2023-06-10 MED ADMIN — LABETALOL 5 MG/ML IV SOLN [10372]: 10 mg | INTRAVENOUS | @ 08:00:00 | NDC 36000032001

## 2023-06-10 MED ADMIN — CHLORTHALIDONE 25 MG PO TAB [1661]: 12.5 mg | ORAL | @ 17:00:00 | NDC 00904690061

## 2023-06-10 MED ADMIN — LIDOCAINE HCL 2 % MM SOLN [81336]: 15 mL | ORAL | @ 12:00:00 | Stop: 2023-06-10 | NDC 00121495015

## 2023-06-10 MED ADMIN — LABETALOL 5 MG/ML IV SOLN [10372]: 10 mg | INTRAVENOUS | @ 20:00:00 | NDC 36000032001

## 2023-06-10 MED ADMIN — ONDANSETRON HCL (PF) 4 MG/2 ML IJ SOLN [136012]: 4 mg | INTRAVENOUS | @ 14:00:00 | NDC 00641607801

## 2023-06-10 MED ADMIN — CARVEDILOL 12.5 MG PO TAB [77424]: 12.5 mg | ORAL | @ 02:00:00 | Stop: 2023-06-14 | NDC 00904630261

## 2023-06-10 MED ADMIN — CARVEDILOL 12.5 MG PO TAB [77424]: 12.5 mg | ORAL | @ 13:00:00 | Stop: 2023-06-10 | NDC 00904630261

## 2023-06-11 MED ADMIN — HEPARIN, PORCINE (PF) 5,000 UNIT/0.5 ML IJ SYRG [95535]: 5000 [IU] | SUBCUTANEOUS | @ 19:00:00 | NDC 00409131611

## 2023-06-11 MED ADMIN — PROCHLORPERAZINE EDISYLATE 5 MG/ML IJ SOLN [6580]: 10 mg | INTRAVENOUS | @ 20:00:00 | Stop: 2023-06-11 | NDC 23155029431

## 2023-06-11 MED ADMIN — LABETALOL 5 MG/ML IV SOLN [10372]: 10 mg | INTRAVENOUS | @ 13:00:00 | NDC 36000032001

## 2023-06-11 MED ADMIN — HYDRALAZINE 20 MG/ML IJ SOLN [3697]: 10 mg | INTRAVENOUS | @ 11:00:00 | Stop: 2023-06-11 | NDC 63323061400

## 2023-06-11 MED ADMIN — CHLORTHALIDONE 25 MG PO TAB [1661]: 12.5 mg | ORAL | @ 13:00:00 | Stop: 2023-06-11 | NDC 00904690061

## 2023-06-11 MED ADMIN — HYDRALAZINE 20 MG/ML IJ SOLN [3697]: 10 mg | INTRAVENOUS | @ 04:00:00 | NDC 63323061400

## 2023-06-11 MED ADMIN — LABETALOL 5 MG/ML IV SOLN [10372]: 10 mg | INTRAVENOUS | @ 11:00:00 | NDC 36000032001

## 2023-06-11 MED ADMIN — LABETALOL 5 MG/ML IV SOLN [10372]: 10 mg | INTRAVENOUS | @ 04:00:00 | NDC 36000032001

## 2023-06-11 MED ADMIN — CHLORTHALIDONE 25 MG PO TAB [1661]: 12.5 mg | ORAL | @ 14:00:00 | Stop: 2023-06-11 | NDC 00904690061

## 2023-06-11 MED ADMIN — CARVEDILOL 25 MG PO TAB [77255]: 25 mg | ORAL | @ 02:00:00 | NDC 00904630361

## 2023-06-11 MED ADMIN — ONDANSETRON HCL (PF) 4 MG/2 ML IJ SOLN [136012]: 4 mg | INTRAVENOUS | @ 07:00:00 | NDC 00641607801

## 2023-06-11 MED ADMIN — ONDANSETRON HCL (PF) 4 MG/2 ML IJ SOLN [136012]: 4 mg | INTRAVENOUS | @ 15:00:00 | NDC 00641607801

## 2023-06-11 MED ADMIN — NIFEDIPINE 30 MG PO TR24 [28643]: 60 mg | ORAL | @ 16:00:00 | NDC 00904720806

## 2023-06-11 MED ADMIN — POTASSIUM CHLORIDE 20 MEQ PO TBTQ [35943]: 40 meq | ORAL | @ 14:00:00 | Stop: 2023-06-11 | NDC 00832532510

## 2023-06-12 ENCOUNTER — Encounter: Admit: 2023-06-12 | Discharge: 2023-06-12 | Payer: MEDICARE

## 2023-06-12 MED ADMIN — HEPARIN, PORCINE (PF) 5,000 UNIT/0.5 ML IJ SYRG [95535]: 5000 [IU] | SUBCUTANEOUS | @ 03:00:00 | NDC 00409131611

## 2023-06-12 MED ADMIN — NIFEDIPINE 30 MG PO TR24 [28643]: 60 mg | ORAL | @ 13:00:00 | Stop: 2023-06-12 | NDC 00904720806

## 2023-06-12 MED ADMIN — CHLORTHALIDONE 25 MG PO TAB [1661]: 25 mg | ORAL | @ 13:00:00 | Stop: 2023-06-12 | NDC 00904690061

## 2023-06-12 MED ADMIN — CARVEDILOL 25 MG PO TAB [77255]: 25 mg | ORAL | @ 13:00:00 | Stop: 2023-06-12 | NDC 68382009501

## 2023-06-12 MED ADMIN — CARVEDILOL 25 MG PO TAB [77255]: 25 mg | ORAL | @ 02:00:00 | NDC 68382009501

## 2023-06-12 MED ADMIN — HEPARIN, PORCINE (PF) 5,000 UNIT/0.5 ML IJ SYRG [95535]: 5000 [IU] | SUBCUTANEOUS | @ 10:00:00 | Stop: 2023-06-12 | NDC 00409131611

## 2023-06-12 MED ADMIN — POTASSIUM CHLORIDE 20 MEQ PO TBTQ [35943]: 40 meq | ORAL | @ 13:00:00 | Stop: 2023-06-12 | NDC 00832532510

## 2023-06-12 MED FILL — CARVEDILOL 25 MG PO TAB: 25 mg | ORAL | 30 days supply | Qty: 60 | Fill #1 | Status: CP

## 2023-06-12 MED FILL — NIFEDIPINE 60 MG PO TR24: 60 mg | ORAL | 30 days supply | Qty: 30 | Fill #1 | Status: CP

## 2023-06-12 MED FILL — CHLORTHALIDONE 25 MG PO TAB: 25 mg | ORAL | 30 days supply | Qty: 30 | Fill #1 | Status: CP

## 2023-06-15 ENCOUNTER — Encounter: Admit: 2023-06-15 | Discharge: 2023-06-15 | Payer: MEDICARE

## 2023-06-15 NOTE — Telephone Encounter
Sheppard Evens called and Lvm sharing that patient Cory Adkins was recently discharged from Dr. Debroah Loop and is having side effects from carvedilol and stated that it has knocked his socks off, BP okay and HR 55-60 bpm. She believes there needs to be a change in his medication.     After reviewing his chart, this patient is not scheduled to follow up with any of the providers here at [facility] center neuro and has a PCP on file, Dr. Tonna Corner.     Rn returned Laura's call to share that since this was Dr. Casimiro Needle Rippee's office and she has contacted the neurology clinic. RN also shared that since this patient was discharged from Dr. Debroah Loop that his PCP would be the provider to follow up with regarding this medication. [name] shared that she understands and that they have an appointment scheduled with Dr. Tonna Corner here soon for the patient and will ask him., She also shares that if she can't get a hold of Dr. Tonna Corner then she will hold the medication for Mr. Artuso because the medication has Knocked his socks off. No further questions at this time.   Philis Fendt, RN

## 2023-06-16 ENCOUNTER — Encounter: Admit: 2023-06-16 | Discharge: 2023-06-16 | Payer: MEDICARE

## 2023-06-16 DIAGNOSIS — I619 Nontraumatic intracerebral hemorrhage, unspecified: Secondary | ICD-10-CM

## 2023-06-16 NOTE — Telephone Encounter
06-16-2023 Per Task Message, requests faxed to Dr Myriam Jacobson, (F) [phone], and Dr Hart Carwin, (F) [phone], clp    Myriam Jacobson, MD  [address], [place], [place] [address]  Phone: [phone]      Collect Outside Records Started 06/22/2023 Henriette Combs, DO, [facility]   [address], [place],  Phone: [phone]

## 2023-06-19 ENCOUNTER — Encounter: Admit: 2023-06-19 | Discharge: 2023-06-19 | Payer: MEDICARE

## 2023-06-19 NOTE — Progress Notes
Research Informed Consent Note    NAME: Cory Adkins             MRN: [id number]             DOB:02/12/1946          AGE: 77 y.o.    IRB Number: [id number] (ASPIRE)  Consent Version and Approval Dates: version 5.0, dated 02Nov2022    Clinical trial participation was discussed per Dr. Warren Danes, Sueanne Margarita, and Greta Doom after patient arrival and stroke diagnosis.     Patient was approached for trial participation.   - Patient was fully engaged in discussion regarding current status, research nature of this trial, and standard of care procedures.   - Patient was informed that the clinical trial is voluntary and they may withdraw consent at any time for any reason by notifying the study team, and withdrawal from study will not interfere with patient's usual care.   - Study purpose, drug, procedures, tests, questionnaires, potential risks and benefits, foreseeable risks and duration of the study were discussed . HIPAA information, financial terms, compensation were reviewed and discussed in the consent form.   - Alternative courses of treatment were also discussed per consent.   - Patient verbalized understanding and appropriate questions were asked and answered by Dr. Warren Danes, Sueanne Margarita, and Greta Doom. The patient was able to review the consent in its entirety prior to signing assent.      Greta Doom obtained informed consent from patient at 1530 on 19-Jun-2023.  A copy of the signed consent was given to patient and contact information for study team was provided. A copy of the consent form was e-mailed to [facility] Management ([facility]) for scanning into the subject's medical record.    No research procedures took place prior to consenting.     Greta Doom, MBA, BSN, RN  Stroke Research Coordinator  [facility] [place] - office: [phone] - email: [email or web address]

## 2023-06-22 ENCOUNTER — Encounter: Admit: 2023-06-22 | Discharge: 2023-06-22 | Payer: MEDICARE

## 2023-06-22 DIAGNOSIS — I619 Nontraumatic intracerebral hemorrhage, unspecified: Secondary | ICD-10-CM

## 2023-06-30 ENCOUNTER — Encounter: Admit: 2023-06-30 | Discharge: 2023-06-30 | Payer: MEDICARE

## 2023-06-30 DIAGNOSIS — Z8546 Personal history of malignant neoplasm of prostate: Secondary | ICD-10-CM

## 2023-06-30 DIAGNOSIS — I480 Paroxysmal atrial fibrillation: Secondary | ICD-10-CM

## 2023-06-30 DIAGNOSIS — M549 Dorsalgia, unspecified: Secondary | ICD-10-CM

## 2023-06-30 DIAGNOSIS — I619 Nontraumatic intracerebral hemorrhage, unspecified: Secondary | ICD-10-CM

## 2023-06-30 DIAGNOSIS — H3562 Retinal hemorrhage, left eye: Secondary | ICD-10-CM

## 2023-06-30 DIAGNOSIS — I10 Essential (primary) hypertension: Secondary | ICD-10-CM

## 2023-06-30 DIAGNOSIS — Z136 Encounter for screening for cardiovascular disorders: Secondary | ICD-10-CM

## 2023-06-30 MED ORDER — NIFEDIPINE 60 MG PO TR24
120 mg | ORAL_TABLET | Freq: Every day | ORAL | 3 refills | 30.00000 days | Status: AC
Start: 2023-06-30 — End: ?

## 2023-06-30 NOTE — Progress Notes
Cardiovascular Medicine       Date of Service: 06/30/2023      HPI     Cory Adkins is a 77 y.o. male who was seen today in the Cardiovascular Medicine Clinic at [facility] at our [place] office.   He is accompanied by his lovely wife, Cory Adkins who is also a patient of mine.    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  He presented to [facility] on 06/08/2023 with headache, light headedness, and impaired balance with mechanical fall.  BP on presentation was 194/94.  CT head with right basal ganglia hemorrhage.  He was transferred to  and his Xarelto was reversed with Kcentra on arrival to [facility].  CTA head negative for any underlying vascular anomaly and MRI with no underlying mass.    Patient has been clinically progressing well with very mild residual left arm weakness (4+/5) and is now almost completely back to baseline.  Patient's blood pressure was significantly elevated while being in the hospital and hence he has been started on 4 blood pressure medications as follows-losartan 100 mg, carvedilol 12.5 mg twice daily, chlorthalidone 25 mg daily, as well as nifedipine 60 mg daily.    Since discharge, he has done fairly well.  His wife reportedly notices no significant neurological deficits because of the stroke.  He does have some cognitive limitations, she is unsure if this is secondary to the stroke.  He also uses a hearing aid.  His blood pressure has been significantly elevated, most readings on the blood pressure log to bring from home are over 135/80.  He has been enrolled in the aspire trial and will follow-up with Dr. Warren Danes in neurology clinic, he is currently back on apixaban and seems to be tolerating it well.              ECG: Sinus bradycardia, voltage criteria for left ventricular hypertrophy.    Transthoracic echocardiogram 06/2023:  Performed at [facility], normal left ventricular and right ventricular size and systolic function, normal valvular function, no evidence of interatrial shunting.        Assessment & Plan   77 y.o. male patient with the following medical problems:    Paroxysmal atrial fibrillation.  Anticoagulation with apixaban.  Hyperlipidemia.  Hypertension.  Intracerebral hemorrhage of the basal ganglia.    Doing well, NYHA class I and denies any symptoms of chest pain, lightheadedness, palpitations, syncope.  No further episodes of symptoms that occurred prior to his presentation with stroke.  Will require aggressive blood pressure control, continue losartan, carvedilol 12.5 mg twice daily.  Bradycardia is prohibitive to increasing the dose of carvedilol, wife states that on the dose of 25 mg twice daily, he was bradycardic and quite lethargic.  Will increase nifedipine to 120 mg extended release daily.  Continue chlorthalidone.  Transthoracic echocardiogram showed preserved left ventricular systolic function recently.  Will continue to monitor lipid panel annually and adjust for goal LDL of less than 70.  Currently on Xarelto, enrolled in the aspire trial (apixaban versus DOAC post a intracerebral bleed).  No evaluation for left atrial appendage occlusion device at this time.    Return to clinic in 6 months.         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     Review of Systems  14 point review of systems negative except as above.    Vitals:    06/30/23 1034   O2 Device: None (Room air)   PainSc: Zero     There is no height or weight on file to calculate BMI.     Physical Exam  General Appearance: no acute distress  HEENT: EOMI, mucous membranes moist, oropharynx is clear  Neck Veins: neck veins are flat & not distended  Carotid Arteries: no bruits  Chest Inspection: chest is normal in appearance  Auscultation/Percussion: lungs clear to auscultation, no rales, rhonchi, or wheezing  Cardiac Rhythm: regular rhythm & normal rate  Cardiac Auscultation: Normal S1 & S2, no S3 or S4, no rub  Murmurs: no cardiac murmurs  Abdominal Exam: soft, non-tender, normal bowel sounds, no masses or bruits  Abdominal aorta: nonpalpable   Liver & Spleen: no organomegaly  Extremities: no lower extremity edema; palpable distal pulses  Skin: warm & intact  Neurologic Exam: oriented to time, place and person; no focal neurologic deficits       Cardiovascular Studies        Cardiovascular Health Factors  Vitals BP Readings from Last 3 Encounters:   06/12/23 (!) 144/81   01/12/20 (!) 145/73   08/16/19 136/76     Wt Readings from Last 3 Encounters:   06/09/23 114.1 kg (251 lb 8.7 oz)   01/12/20 111.6 kg (246 lb)   08/02/19 113.5 kg (250 lb 3.2 oz)     BMI Readings from Last 3 Encounters:   06/09/23 34.12 kg/m?   01/12/20 33.69 kg/m?   08/02/19 33.93 kg/m?      Smoking Social History     Tobacco Use   Smoking Status Former   Smokeless Tobacco Never      Lipid Profile Cholesterol   Date Value Ref Range Status   06/08/2023 160 <200 MG/DL Final     HDL   Date Value Ref Range Status   06/08/2023 40 (L) >40 MG/DL Final     LDL   Date Value Ref Range Status   06/08/2023 119 (H) <100 mg/dL Final Triglycerides   Date Value Ref Range Status   06/08/2023 113 <150 MG/DL Final      Blood Sugar Hemoglobin A1C   Date Value Ref Range Status   06/04/2011 5.3  Final     Glucose   Date Value Ref Range Status   06/12/2023 108 (H) 70 - 100 MG/DL Final   16/07/9603 540 (H) 70 - 100 MG/DL Final   98/08/9146 95 70 - 100 MG/DL Final        ASCVD Risk Assessment:     ASCVD 10-year risk calculated: The ASCVD Risk score (Arnett DK, et al., 2019) failed to calculate  for the following reasons:    The patient has a prior MI or stroke diagnosis     LDL 70-189, if ASCVD 10-y risk is >7.5%, high to moderate-intensity statin therapy is recommended  Diabetes with ASCVD 10-y risk >7.5%, high-intensity statin therapy is recommended.  Diabetes with ASCVD 10-y risk <7.5%, moderate-intensity statin therapy is recommended.      Current Medications (including today's revisions)   ALPRAZolam (XANAX) 0.25 mg tablet Take one tablet by mouth three times daily as needed for Anxiety.    carvediloL (COREG) 25 mg tablet Take one tablet by mouth twice daily. Take with food.  Indications: high blood pressure    chlorthalidone (HYGROTON) 25 mg tablet Take one tablet by mouth daily with breakfast. Indications: high blood pressure    ER NIFEdipine XL (PROCARDIA XL) 60 mg tablet,extended release 24 hr Take one tablet by mouth daily. Indications: high blood pressure    loratadine (CLARITIN) 10 mg tablet Take 10 mg by mouth every morning.    losartan (COZAAR) 100 mg tablet Take 100 mg by mouth daily.    meloxicam (MOBIC) 7.5 mg tablet Take 7.5 mg by mouth daily.         Orpah Cobb MD  Cardiovascular Medicine.

## 2023-06-30 NOTE — Patient Instructions
Thank you for visiting our office today.    We would like to make the following medication adjustments:      Increase nifedipine from 60 mg to 120 mg daily       Otherwise continue the same medications as you have been doing.            We will plan to see you back in 6 months.  Please call us in the meantime with any questions or concerns.        Please allow 5-7 business days for our providers to review your results. All normal results will go to MyChart. If you do not have Mychart, it is strongly recommended to get this so you can easily view all your results. If you do not have mychart, we will attempt to call you once with normal lab and testing results. If we cannot reach you by phone with normal results, we will send you a letter.  If you have not heard the results of your testing after one week please give [place] a call.       Your Cardiovascular Medicine [place] [facility] Team Brett Canales, Pilar Jarvis and [place])  phone number is [phone].

## 2023-07-02 ENCOUNTER — Encounter: Admit: 2023-07-02 | Discharge: 2023-07-02 | Payer: MEDICARE

## 2023-07-02 ENCOUNTER — Ambulatory Visit: Admit: 2023-07-02 | Discharge: 2023-07-03 | Payer: MEDICARE

## 2023-07-02 DIAGNOSIS — I10 Essential (primary) hypertension: Secondary | ICD-10-CM

## 2023-07-02 DIAGNOSIS — Z8546 Personal history of malignant neoplasm of prostate: Secondary | ICD-10-CM

## 2023-07-02 DIAGNOSIS — H3562 Retinal hemorrhage, left eye: Secondary | ICD-10-CM

## 2023-07-02 DIAGNOSIS — I480 Paroxysmal atrial fibrillation: Secondary | ICD-10-CM

## 2023-07-02 DIAGNOSIS — M549 Dorsalgia, unspecified: Secondary | ICD-10-CM

## 2023-07-02 DIAGNOSIS — I619 Nontraumatic intracerebral hemorrhage, unspecified: Secondary | ICD-10-CM

## 2023-07-03 ENCOUNTER — Encounter: Admit: 2023-07-03 | Discharge: 2023-07-03 | Payer: MEDICARE

## 2023-07-03 DIAGNOSIS — I619 Nontraumatic intracerebral hemorrhage, unspecified: Secondary | ICD-10-CM

## 2023-08-12 ENCOUNTER — Encounter: Admit: 2023-08-12 | Discharge: 2023-08-12 | Payer: MEDICARE

## 2023-09-25 ENCOUNTER — Encounter: Admit: 2023-09-25 | Discharge: 2023-09-25 | Payer: MEDICARE

## 2023-09-25 NOTE — Progress Notes
Research Progress Note    NAME: Cory Adkins             MRN: [id number]             DOB:March 07, 1946          AGE: 77 y.o.    IRB Number: [id number] (ASPIRE)       17-month follow up visit completed with patient in his home. Patient's wife was also present for the visit.   - New supply of study drug provided and prior bottles returned to me.   - Patient verbalizes correct study drug administration process and denies questions at this time.       Greta Doom, MBA, BSN, RN, SCRN  Stroke Research Coordinator  [facility] [place] - office: [phone] - email: [email or web address]

## 2023-12-03 ENCOUNTER — Encounter: Admit: 2023-12-03 | Discharge: 2023-12-03 | Payer: MEDICARE

## 2023-12-08 ENCOUNTER — Encounter: Admit: 2023-12-08 | Discharge: 2023-12-08 | Payer: MEDICARE

## 2023-12-10 ENCOUNTER — Encounter: Admit: 2023-12-10 | Discharge: 2023-12-10 | Payer: MEDICARE

## 2023-12-10 DIAGNOSIS — I480 Paroxysmal atrial fibrillation: Secondary | ICD-10-CM

## 2023-12-10 DIAGNOSIS — I10 Essential (primary) hypertension: Secondary | ICD-10-CM

## 2023-12-10 DIAGNOSIS — E785 Hyperlipidemia, unspecified: Secondary | ICD-10-CM

## 2023-12-10 DIAGNOSIS — Z136 Encounter for screening for cardiovascular disorders: Secondary | ICD-10-CM

## 2023-12-10 DIAGNOSIS — E7800 Pure hypercholesterolemia, unspecified: Secondary | ICD-10-CM

## 2023-12-10 DIAGNOSIS — Z01810 Encounter for preprocedural cardiovascular examination: Secondary | ICD-10-CM

## 2023-12-10 LAB — LIPID PROFILE
CHOLESTEROL/HDL %: 5
CHOLESTEROL: 177
HDL: 34 — ABNORMAL LOW
LDL: 105 — ABNORMAL HIGH
TRIGLYCERIDES: 193 — ABNORMAL HIGH
VLDL: 39

## 2023-12-29 ENCOUNTER — Encounter: Admit: 2023-12-29 | Discharge: 2023-12-29 | Payer: MEDICARE

## 2023-12-29 NOTE — Progress Notes
 Research Progress Note    NAME: Cory Adkins             MRN: [id number]             DOB:04/24/1946          AGE: 78 y.o.    IRB Number: [id number] (ASPIRE)         - 70-month follow up visit completed with patient in his home. Patient's wife was also present for the visit.   - New supply of study drug provided and prior bottles returned to me.   - Patient verbalizes correct study drug administration process and denies questions at this time.         Greta Doom, MBA, BSN, RN, SCRN  Stroke Research Coordinator  [facility] [place] - office: [phone] - email: [email or web address]

## 2024-01-06 ENCOUNTER — Encounter: Admit: 2024-01-06 | Discharge: 2024-01-06

## 2024-01-06 NOTE — Telephone Encounter
-----   Message from Terence Lux, MD sent at 01/06/2024 11:33 AM CDT -----  Please notify patient that his LDL was elevated.   I would recommend he start atorvastatin 20 mg daily and repeat an FLP in 6 months.   Thanks  [employer]

## 2024-01-06 NOTE — Telephone Encounter
 Called and discussed with patient.  He would like to discuss with his wife.  He will call us back and let us know if he is willing to start medication as recommended.

## 2024-02-01 ENCOUNTER — Encounter: Admit: 2024-02-01 | Discharge: 2024-02-01 | Payer: MEDICARE

## 2024-03-30 ENCOUNTER — Encounter: Admit: 2024-03-30 | Discharge: 2024-03-30 | Payer: MEDICARE

## 2024-03-30 NOTE — Progress Notes
 Research Progress Note    NAME: Cory Adkins             MRN: [id number]             DOB:01-18-1946          AGE: 78 y.o.    IRB Number: [id number] (ASPIRE)        - 9-month follow up visit completed with patient in his home. Patient's wife was also present for the visit.   - New supply of study drug provided and prior bottles returned to me.   - Patient verbalizes correct study drug administration process and denies questions at this time.         Rollene Mix, MBA, BSN, RN, SCRN  Stroke Research Coordinator  [facility] [place] - office: [phone] - email: [email or web address]

## 2024-05-17 ENCOUNTER — Encounter: Admit: 2024-05-17 | Discharge: 2024-05-17 | Payer: MEDICARE

## 2024-06-14 ENCOUNTER — Encounter: Admit: 2024-06-14 | Discharge: 2024-06-14 | Payer: MEDICARE

## 2024-06-14 DIAGNOSIS — I480 Paroxysmal atrial fibrillation: Secondary | ICD-10-CM

## 2024-06-14 DIAGNOSIS — Z136 Encounter for screening for cardiovascular disorders: Principal | ICD-10-CM

## 2024-06-14 DIAGNOSIS — I10 Essential (primary) hypertension: Secondary | ICD-10-CM

## 2024-06-14 DIAGNOSIS — R609 Edema, unspecified: Secondary | ICD-10-CM

## 2024-06-18 ENCOUNTER — Encounter: Admit: 2024-06-18 | Discharge: 2024-06-18 | Payer: MEDICARE

## 2024-06-20 ENCOUNTER — Ambulatory Visit: Admit: 2024-06-20 | Discharge: 2024-06-20 | Payer: MEDICARE

## 2024-06-20 ENCOUNTER — Encounter: Admit: 2024-06-20 | Discharge: 2024-06-20 | Payer: MEDICARE

## 2024-06-29 ENCOUNTER — Encounter: Admit: 2024-06-29 | Discharge: 2024-06-29 | Payer: MEDICARE

## 2024-07-01 ENCOUNTER — Encounter: Admit: 2024-07-01 | Discharge: 2024-07-01 | Payer: MEDICARE

## 2024-07-01 NOTE — Telephone Encounter
 Pt called to say that he has been having some trouble with his blood pressure lately and has been in the ER and hospital at [facility] for this issue. Pt was wanting to make an appointment to see Dr Elon. Appointment made for 10/16.

## 2024-07-20 ENCOUNTER — Encounter: Admit: 2024-07-20 | Discharge: 2024-07-20 | Payer: MEDICARE

## 2024-07-21 ENCOUNTER — Encounter: Admit: 2024-07-21 | Discharge: 2024-07-21 | Payer: MEDICARE

## 2024-07-21 VITALS — BP 148/89 | HR 61 | Ht 71.0 in | Wt 243.2 lb

## 2024-07-21 DIAGNOSIS — Z136 Encounter for screening for cardiovascular disorders: Principal | ICD-10-CM

## 2024-07-21 DIAGNOSIS — I480 Paroxysmal atrial fibrillation: Secondary | ICD-10-CM

## 2024-07-21 DIAGNOSIS — R609 Edema, unspecified: Secondary | ICD-10-CM

## 2024-07-21 DIAGNOSIS — I10 Essential (primary) hypertension: Secondary | ICD-10-CM

## 2024-07-21 MED ORDER — CARVEDILOL 6.25 MG PO TAB
6.25 mg | ORAL_TABLET | Freq: Two times a day (BID) | ORAL | 3 refills | 90.00000 days | Status: AC
Start: 2024-07-21 — End: ?

## 2024-07-21 MED ORDER — SPIRONOLACTONE 50 MG PO TAB
50 mg | ORAL_TABLET | Freq: Every day | ORAL | 3 refills | 90.00000 days | Status: AC
Start: 2024-07-21 — End: ?

## 2024-07-21 NOTE — Progress Notes
 Cardiovascular Medicine       Date of Service: 07/21/2024      HPI     Cory Adkins is a 78 y.o. male who was seen today in the Cardiovascular Medicine Clinic at [facility] at our [place] office.       He presents today for follow-up.     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  He presented to [facility] on 06/08/2023 with headache, light headedness, and impaired balance with mechanical fall.  BP on presentation was 194/94.  CT head with right basal ganglia hemorrhage.  He was transferred to [place] and his Xarelto was reversed with Kcentra on arrival to [facility].  CTA head negative for any underlying vascular anomaly and MRI with no underlying mass.       Patient has been clinically progressing well with very mild residual left arm weakness (4+/5) and is now almost completely back to baseline.    Since discharge, he has done fairly well.  His wife reportedly notices no significant neurological deficits because of the stroke.  He does have some cognitive limitations, she is unsure if this is secondary to the stroke.  He also uses a hearing aid.  He has been enrolled in the aspire trial and will follow-up with Dr. Sharyne in neurology clinic.  He is provide aspirin versus apixaban (double-blind) and this is managed through neurology clinic.    He was recently in the hospital for hypertensive crisis.  He reports his systolic blood pressure was over 180.  He does not have any symptoms associated with this as far as he can tell.  Several blood pressure medication changes were made, unclear why nifedipine  was discontinued and diltiazem was added.  He does have known issues with bradycardia and we have been decreasing his carvedilol  dose over the past few months.     Transthoracic echocardiogram 06/2023:  Performed at [facility], normal left ventricular and right ventricular size and systolic function, normal valvular function, no evidence of interatrial shunting.        ECG in clinic today shows sinus bradycardia              Assessment & Plan   79 y.o. male patient with the following medical problems:     Paroxysmal atrial fibrillation.  Anticoagulation with apixaban.  Hyperlipidemia.  Hypertension.  Intracerebral hemorrhage of the basal ganglia.     Several changes in his recent blood pressure medications with suboptimal blood pressure control.  Previously, bradycardia has been prohibitive to increasing his dose of carvedilol .  Unfortunately, even though we decreased his dose of carvedilol , he appears to be taking the previous higher dose of 12.5 mg twice daily.  Extensively discussed medication reconciliation with him.  Included below, the reconciliation.  Included below, the list of antihypertensive therapy that I recommended for him.  Coreg  6.25 BID  HCTZ 25 mg daily  Losartan  100 mg daily  Will start spironolactone 50 mg daily  Discontinue furosemide, he does not report making much urine with doses  Will discontinue diltiazem given ongoing issues with bradycardia and dizziness.  He takes a potassium supplement as he has been taking Lasix.  Given that we are discontinuing the Lasix and adding spironolactone, we will discontinue his potassium supplementation  Transthoracic echocardiogram showed preserved left ventricular  systolic function recently.  Lipid panel with elevated LDL, patient and wife are again starting any lipid-lowering therapy at this time.  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       Return to clinic in 3 months                  Past Medical History  Patient Active Problem List    Diagnosis Date Noted    Dyslipidemia 06/10/2023    Paroxysmal A-fib (CMS-HCC) 06/10/2023    Anticoagulated 06/10/2023    History of retinal hemorrhage 06/10/2023 Nontraumatic intracerebral hemorrhage of basal ganglia (CMS-HCC) 06/08/2023    Pure hypercholesterolemia 07/06/2018    Edema 06/09/2017    Preop cardiovascular exam 05/22/2016    Bilateral leg edema 05/22/2016    Dry cough 09/04/2015    History of cholecystectomy 08/27/2015    Essential hypertension 04/02/2009    Chronic back pain 04/02/2009    History of prostate cancer 04/02/2009       I reviewed and confirmed this patient's problem list, active medications, allergies, and past medical, social, family & tobacco histories.     Review of Systems  Review of Systems   Constitutional: Negative.   HENT: Negative.     Eyes: Negative.    Cardiovascular: Negative.    Respiratory: Negative.     Endocrine: Negative.    Hematologic/Lymphatic: Negative.    Skin: Negative.    Musculoskeletal: Negative.    Gastrointestinal: Negative.    Genitourinary: Negative.    Neurological: Negative.    Psychiatric/Behavioral: Negative.     Allergic/Immunologic: Negative.      14 point review of systems negative except as above.    Vitals:    07/21/24 1005   BP Source: Arm, Left Upper   O2 Device: None (Room air)   PainSc: Zero   Height: 180.3 cm (5' 11)     Body mass index is 33.82 kg/m?SABRA     Physical Exam  General Appearance: no acute distress  HEENT: EOMI, mucous membranes moist, oropharynx is clear  Neck Veins: neck veins are flat & not distended  Carotid Arteries: no bruits  Chest Inspection: chest is normal in appearance  Auscultation/Percussion: lungs clear to auscultation, no rales, rhonchi, or wheezing  Cardiac Rhythm: regular rhythm & normal rate  Cardiac Auscultation: Normal S1 & S2, no S3 or S4, no rub  Murmurs: no cardiac murmurs  Abdominal Exam: soft, non-tender, normal bowel sounds, no masses or bruits  Abdominal aorta: nonpalpable   Liver & Spleen: no organomegaly  Extremities: no lower extremity edema; palpable distal pulses  Skin: warm & intact  Neurologic Exam: oriented to time, place and person; no focal neurologic deficits       Cardiovascular Studies  06/20/24   2D + DOPPLER ECHO   Result Value Ref Range    BSA 2.35 m2    LVIDD 4.4 4.2 - 5.8 cm    LVIDS 2.8 2.5 - 4 cm    IVS 1.4 0.6 - 1 cm    PW 1.3 0.6 - 1 cm    FS 36.36 28 - 44 %    Teichholtz 61.82 %    LA size 4.1 3 - 4 cm    LV mass 228 88 - 224 g    Left Ventricle Mass Index 97 49 - 115 g/m2    RWT 0.59 <=0.42    Right Ventricular Basal Diameter 3.6 2.5 - 4.1 cm    Right Atrial Area  17.0 <18 cm2    Right Ventricular Mid Diameter 3.2 1.9 - 3.5 cm    Right Heart Systolic Mmode TAPSE 2.2 >1.7 cm    Sinus 3.5 2.8 - 4 cm    Ascending aorta 3.6 cm    LVOT diameter 2.1 cm    LVOT area 3.46 cm2    LVOT peak vel 1.1 m/s    LVOT peak VTI 19.0 cm    LVOT stroke volume 65.81 cm3    AV peak velocity 1.1 m/s    Ao VTI 21.0 cm    Aortic valve area = 3.13 cm2    , with a mean gradient of 5 mmHg    and a peak gradient of 3 mmHg    AV index (native) 1.00     AV Stroke Volume Index 27     E/A ratio 0.71     TDI Medial e' 0.060 m/s    Medial E/E' ratio 8.33     TDI lateral e' 0.050 m/s    Lateral E/E' ratio 10.00     MV Peak E Vel PW 0.500 m/s    MV Peak A Vel 0.700 m/s    Right Heart Systolic TDI S' 0.1 m/s    Left Ventricle Systolic Volume 33 21 - 61 mL    Left Ventricle Systolic Volume Index 14 11 - 31 mL/m2    Left Ventricle Diastolic Volume 85 62 - 150 mL    Left Ventricle Diastolic Volume Index 36 34 - 74 mL/m2    LA volume 60 18 - 58 mL    Left Atrium Index 25.53 16 - 34 mL/m2    ECHO EF 60 %        Cardiovascular Health Factors  Vitals BP Readings from Last 3 Encounters:   06/20/24 (!) 150/95   06/14/24 (!) 144/88   12/08/23 138/83     Wt Readings from Last 3 Encounters:   06/20/24 110 kg (242 lb 8.1 oz)   06/14/24 111.5 kg (245 lb 12.8 oz)   12/08/23 110.4 kg (243 lb 6.4 oz)     BMI Readings from Last 3 Encounters:   07/21/24 33.82 kg/m?   06/20/24 33.82 kg/m?   06/14/24 33.34 kg/m?      Smoking Tobacco Use History[1]   Lipid Profile Cholesterol   Date Value Ref Range Status 12/10/2023 177  Final     HDL   Date Value Ref Range Status   12/10/2023 34 (L)  Final     LDL   Date Value Ref Range Status   12/10/2023 105 (H)  Final     Triglycerides   Date Value Ref Range Status   12/10/2023 193 (H)  Final      Blood Sugar Hemoglobin A1C   Date Value Ref Range Status   06/04/2011 5.3  Final     Glucose   Date Value Ref Range Status   07/01/2024 107 (H) 70 - 105 Final   06/17/2024 129 (H) 70 - 105 Final   11/24/2023 106 (H) 70 - 105 Final        ASCVD Risk Assessment:     ASCVD 10-year risk calculated: The ASCVD Risk score (Arnett DK, et al., 2019) failed to calculate for the following reasons:    Risk score cannot be calculated because patient has a medical history suggesting prior/existing ASCVD     LDL 70-189, if ASCVD 10-y risk is >7.5%, high to moderate-intensity statin therapy is recommended  Diabetes with ASCVD 10-y  risk >7.5%, high-intensity statin therapy is recommended.  Diabetes with ASCVD 10-y risk <7.5%, moderate-intensity statin therapy is recommended.      Current Medications (including today's revisions)   acetaminophen  (TYLENOL ) 325 mg tablet Take one tablet by mouth every 4 hours as needed for Pain.    carvediloL  (COREG ) 6.25 mg tablet Take one tablet by mouth twice daily with meals. Take with food.    digoxin (LANOXIN) 125 mcg (0.125 mg) tablet Take one tablet by mouth daily.    diphenhydrAMINE HCL (BENADRYL) 25 mg capsule Take one capsule by mouth every 6 hours as needed.    fesoterodine ER (TOVIAZ) 8 mg tablet Take one tablet by mouth daily.    fish oil /omega-3 fatty acids 1,600-500-800 mg/5 mL Take 3.125 mL by mouth twice daily with meals.    hydroCHLOROthiazide  (HYDRODIURIL ) 50 mg tablet Take one tablet by mouth daily.    losartan  (COZAAR ) 100 mg tablet Take one tablet by mouth daily.    meloxicam (MOBIC) 7.5 mg tablet Take one tablet by mouth daily.    omeprazole DR (PRILOSEC) 40 mg capsule Take one capsule by mouth daily before breakfast.    spironolactone (ALDACTONE) 50 mg tablet Take one tablet by mouth daily. Take with food.         Rosamond Boettcher MD  Cardiovascular Medicine.          [1]   Social History  Tobacco Use   Smoking Status Former   Smokeless Tobacco Never

## 2024-07-21 NOTE — Patient Instructions
 Thank you for visiting our office today.    We would like to make the following medication adjustments:      Stop taking diltiazem, lasix and potassium    Change carvedilol  to 6.25 mg twice daily- a new prescription was sent in to your pharmacy    Start taking Spironolactone 50 mg once daily.       Otherwise continue the same medications as you have been doing.            We will plan to see you back in February.  Please call us  in the meantime with any questions or concerns.        Please allow 5-7 business days for our providers to review your results. All normal results will go to MyChart. If you do not have Mychart, it is strongly recommended to get this so you can easily view all your results. If you do not have mychart, we will attempt to call you once with normal lab and testing results. If we cannot reach you by phone with normal results, we will send you a letter.  If you have not heard the results of your testing after one week please give us  a call.       Your Cardiovascular Medicine [place] Team [place], [place] Pierce and [place])  phone number is [phone].

## 2024-07-23 ENCOUNTER — Encounter: Admit: 2024-07-23 | Discharge: 2024-07-23 | Payer: MEDICARE

## 2024-07-23 MED ORDER — CARVEDILOL 12.5 MG PO TAB
12.5 mg | ORAL_TABLET | Freq: Two times a day (BID) | ORAL | 1 refills | 90.00000 days | Status: AC
Start: 2024-07-23 — End: ?

## 2024-07-23 NOTE — Telephone Encounter
 Patient called and stated his BP is 174/110 HR 81, post medications today. Patient stated he had a brain bleed last year from elevated BP.  Patient advised to go to nearest ER.  Patient stated that he was told by Dr. Rosamond, not to go to ER, to call cardiology.  I then spoke with DR. Karalee, on call physician, per Dr. Karalee has advised pt to go to ER and also increase his Carvedilol  dose to 12.5 mg BID.  I called patient with recommendations. Patient stated he will go to [facility] in [place], [place].  Carvedilol  prescription sent to patient pharmacy.  IB sent to [facility] nurse team to follow up with patient on Monday.

## 2024-09-26 ENCOUNTER — Encounter: Admit: 2024-09-26 | Discharge: 2024-09-26 | Payer: MEDICARE

## 2024-09-26 NOTE — Progress Notes [1]
 Research Progress Note    NAME: Cory Adkins             MRN: [id number]             DOB:03-23-1946          AGE: 78 y.o.    IRB Number: [id number] (ASPIRE)         - 18-month follow up visit completed with patient in his home. Patient's wife was also present for the visit.   - New supply of study drug provided and prior bottles returned to me.   - Patient verbalizes correct study drug administration process and denies questions at this time.     Rollene Mix, MBA, BSN, RN, SCRN  Clinical Research Nurse Coordinator  [facility]   [employer] - office: [phone] - email: [email or web address]

## 2024-10-17 ENCOUNTER — Encounter: Admit: 2024-10-17 | Discharge: 2024-10-17 | Payer: MEDICARE

## 2024-11-09 ENCOUNTER — Encounter: Admit: 2024-11-09 | Discharge: 2024-11-09 | Payer: MEDICARE

## 2024-11-09 MED ORDER — CARVEDILOL 6.25 MG PO TAB
6.25 mg | ORAL_TABLET | Freq: Two times a day (BID) | ORAL | 3 refills | 90.00000 days | Status: AC
Start: 2024-11-09 — End: ?
